# Patient Record
Sex: Female | Born: 1975 | Race: White | Hispanic: No | Marital: Married | Smoking: Never smoker
Health system: Southern US, Community
[De-identification: ages and names within clinical notes are randomized; demographics above are authoritative.]

## PROBLEM LIST (undated history)

## (undated) DIAGNOSIS — N809 Endometriosis, unspecified: Secondary | ICD-10-CM

## (undated) DIAGNOSIS — G40909 Epilepsy, unspecified, not intractable, without status epilepticus: Secondary | ICD-10-CM

---

## 2017-05-26 ENCOUNTER — Encounter: Payer: Self-pay | Admitting: Obstetrics & Gynecology

## 2019-01-19 ENCOUNTER — Other Ambulatory Visit (HOSPITAL_BASED_OUTPATIENT_CLINIC_OR_DEPARTMENT_OTHER): Payer: BC Managed Care – PPO

## 2019-01-19 ENCOUNTER — Other Ambulatory Visit: Payer: Self-pay

## 2019-01-19 ENCOUNTER — Emergency Department
Admission: EM | Admit: 2019-01-19 | Discharge: 2019-01-19 | Disposition: A | Discharge status: Home / Self Care | Payer: BC Managed Care – PPO | Source: Home / Self Care

## 2019-01-19 ENCOUNTER — Emergency Department (INDEPENDENT_AMBULATORY_CARE_PROVIDER_SITE_OTHER): Payer: BC Managed Care – PPO

## 2019-01-19 DIAGNOSIS — N200 Calculus of kidney: Secondary | ICD-10-CM

## 2019-01-19 DIAGNOSIS — R109 Unspecified abdominal pain: Secondary | ICD-10-CM

## 2019-01-19 DIAGNOSIS — N83292 Other ovarian cyst, left side: Secondary | ICD-10-CM

## 2019-01-19 DIAGNOSIS — N83202 Unspecified ovarian cyst, left side: Secondary | ICD-10-CM

## 2019-01-19 DIAGNOSIS — R1032 Left lower quadrant pain: Secondary | ICD-10-CM

## 2019-01-19 DIAGNOSIS — R102 Pelvic and perineal pain: Secondary | ICD-10-CM

## 2019-01-19 HISTORY — DX: Endometriosis, unspecified: N80.9

## 2019-01-19 HISTORY — DX: Epilepsy, unspecified, not intractable, without status epilepticus: G40.909

## 2019-01-19 LAB — COMPLETE METABOLIC PANEL WITH GFR
AG Ratio: 1.7 (calc) (ref 1.0–2.5)
ALT: 13 U/L (ref 6–29)
AST: 15 U/L (ref 10–30)
Albumin: 4.7 g/dL (ref 3.6–5.1)
Alkaline phosphatase (APISO): 88 U/L (ref 31–125)
BUN: 14 mg/dL (ref 7–25)
CO2: 26 mmol/L (ref 20–32)
Calcium: 9.7 mg/dL (ref 8.6–10.2)
Chloride: 106 mmol/L (ref 98–110)
Creat: 0.67 mg/dL (ref 0.50–1.10)
GFR, Est African American: 127 mL/min/{1.73_m2} (ref >=60)
GFR, Est Non African American: 109 mL/min/{1.73_m2} (ref >=60)
Globulin: 2.7 g/dL (calc) (ref 1.9–3.7)
Glucose, Bld: 97 mg/dL (ref 65–99)
Potassium: 3.9 mmol/L (ref 3.5–5.3)
Sodium: 139 mmol/L (ref 135–146)
Total Bilirubin: 0.2 mg/dL (ref 0.2–1.2)
Total Protein: 7.4 g/dL (ref 6.1–8.1)

## 2019-01-19 LAB — POCT URINALYSIS DIP (MANUAL ENTRY)
Bilirubin, UA: NEGATIVE
Blood, UA: NEGATIVE
Glucose, UA: NEGATIVE mg/dL
Ketones, POC UA: NEGATIVE mg/dL
Nitrite, UA: NEGATIVE
Protein Ur, POC: NEGATIVE mg/dL
Spec Grav, UA: 1.02 (ref 1.010–1.025)
Urobilinogen, UA: 0.2 E.U./dL
pH, UA: 7 (ref 5.0–8.0)

## 2019-01-19 LAB — POCT CBC W AUTO DIFF (K'VILLE URGENT CARE)

## 2019-01-19 LAB — POCT URINE PREGNANCY: Preg Test, Ur: NEGATIVE

## 2019-01-19 NOTE — ED Provider Notes (Signed)
[name]    CSN: [id number] Arrival date & time: 01/19/19  1127     History   Chief Complaint Chief Complaint  Patient presents with   Abdominal Pain    HPI Jill SkeensKimberly Guerra is a 43 y.o. female.   HPI Jill SkeensKimberly Guerra is a 43 y.o. female presenting to UC with c/o intermittent Left flank pain that radiates into left lower abdomen for the last few days.  Pain worsens after urinating.  Around 3AM this morning, pt was awakened by severe sharp pain, then had an episode of urinary incontinence, which has never happened to pt before. Pt reports hx of 5 c-sections as well as hx of severe endometriosis, ovarian cysts and removal of her Left fallopian tube after an ectopic pregnancy a few years ago.  She still has her Left ovary.  Denies pain at this time. Denies fever, chills, n/v/d.    Past Medical History:  Diagnosis Date   Endometriosis    Epilepsy (HCC)     There are no active problems to display for this patient.   Past Surgical History:  Procedure Laterality Date   CESAREAN SECTION      OB History   No obstetric history on file.      Home Medications    Prior to Admission medications   Medication Sig Start Date End Date Taking? Authorizing Provider  levETIRAcetam (KEPPRA) 750 MG tablet Take 1,500 mg by mouth. Takes 1500mg  in an and 2250 in pm   Yes [provider]    Family History History reviewed. No pertinent family history.  Social History Social History   Tobacco Use   Smoking status: Never Smoker   Smokeless tobacco: Never Used  Substance Use Topics   Alcohol use: Yes    Comment: occasionally   Drug use: Never     Allergies   Tegretol [carbamazepine]   Review of Systems Review of Systems  Constitutional: Negative for chills and fever.  Gastrointestinal: Positive for abdominal pain. Negative for diarrhea, nausea and vomiting.  Genitourinary: Positive for dysuria, flank pain and pelvic pain. Negative for  frequency, hematuria and urgency.  Musculoskeletal: Negative for back pain.  Neurological: Negative for dizziness, light-headedness and headaches.     Physical Exam Triage Vital Signs ED Triage Vitals  Enc Vitals Group     BP 01/19/19 1200 119/72     Pulse Rate 01/19/19 1151 87     Resp 01/19/19 1151 20     Temp 01/19/19 1151 98.7 F (37.1 C)     Temp Source 01/19/19 1151 Oral     SpO2 01/19/19 1151 100 %     Weight 01/19/19 1152 153 lb (69.4 kg)     Height 01/19/19 1152 5\' 4"  (1.626 m)     Head Circumference --      Peak Flow --      Pain Score 01/19/19 1152 2     Pain Loc --      Pain Edu? --      Excl. in GC? --    No data found.  Updated Vital Signs BP 119/72    Pulse 87    Temp 98.7 F (37.1 C) (Oral)    Resp 20    Ht 5\' 4"  (1.626 m)    Wt 153 lb (69.4 kg)    LMP 01/15/2019    SpO2 100%    BMI 26.26 kg/m   Visual Acuity Right Eye Distance:   Left Eye Distance:   Bilateral Distance:  Right Eye Near:   Left Eye Near:    Bilateral Near:     Physical Exam Vitals signs and nursing note reviewed.  Constitutional:      Appearance: She is well-developed.     Comments: Pt sitting on exam table, NAD.   HENT:     Head: Normocephalic and atraumatic.  Neck:     Musculoskeletal: Normal range of motion.  Cardiovascular:     Rate and Rhythm: Normal rate and regular rhythm.  Pulmonary:     Effort: Pulmonary effort is normal.     Breath sounds: Normal breath sounds.  Abdominal:     General: There is no distension.     Palpations: Abdomen is soft.     Tenderness: There is abdominal tenderness in the suprapubic area and left lower quadrant. There is left CVA tenderness. There is no right CVA tenderness.  Musculoskeletal: Normal range of motion.  Skin:    General: Skin is warm and dry.  Neurological:     Mental Status: She is alert and oriented to person, place, and time.  Psychiatric:        Behavior: Behavior normal.      UC Treatments / Results  Labs (all  labs ordered are listed, but only abnormal results are displayed) Labs Reviewed  POCT URINALYSIS DIP (MANUAL ENTRY) - Abnormal; Notable for the following components:      Result Value   Leukocytes, UA Small (1+) (*)    All other components within normal limits  URINE CULTURE  COMPLETE METABOLIC PANEL WITH GFR  CBC WITH DIFFERENTIAL/PLATELET  POCT URINE PREGNANCY  POCT CBC W AUTO DIFF ([facility])    EKG   Radiology Ct Abdomen Pelvis Wo Contrast  Result Date: 01/19/2019 CLINICAL DATA:  Severe left-sided flank pain. EXAM: CT ABDOMEN AND PELVIS WITHOUT CONTRAST TECHNIQUE: Multidetector CT imaging of the abdomen and pelvis was performed following the standard protocol without IV contrast. COMPARISON:  None. FINDINGS: Lower chest: No acute abnormality. Hepatobiliary: Scattered small simple cysts in the liver. Other subcentimeter low-density lesions are too small to characterize, but likely represent cysts as well. The gallbladder is unremarkable. No biliary dilatation. Pancreas: Unremarkable. No pancreatic ductal dilatation or surrounding inflammatory changes. Spleen: Normal in size without focal abnormality. Adrenals/Urinary Tract: The adrenal glands are unremarkable. Small bilateral renal calculi. No ureteral calculi or hydronephrosis. 1.8 cm cystic lesion in the lower pole of the left kidney with small thin peripheral calcification posteriorly. The bladder is unremarkable for the degree of distention. Stomach/Bowel: Stomach is within normal limits. Appendix appears normal. No evidence of bowel wall thickening, distention, or inflammatory changes. Vascular/Lymphatic: No significant vascular findings are present. No enlarged abdominal or pelvic lymph nodes. Reproductive: The uterus and right adnexa are unremarkable. There is a 6.6 cm complex cyst in the left adnexa with a thin internal septation and possible small fluid fluid level. Other: Trace free fluid in the pelvis is likely physiologic.  No pneumoperitoneum. Musculoskeletal: No acute or significant osseous findings. IMPRESSION: 1. 6.6 cm complex left ovarian cyst with thin internal septation and possible small fluid fluid level, potentially a hemorrhagic cyst. Pelvic ultrasound is recommended for further evaluation. 2. Small bilateral nonobstructive nephrolithiasis. 3. 1.8 cm cystic lesion in the left kidney with thin peripheral calcification, incompletely characterized. Outpatient dedicated renal protocol CT or MRI is recommended for further evaluation. This recommendation follows ACR consensus guidelines: Management of the Incidental Renal Mass on CT: A White Paper of the ACR Incidental Findings Committee. J Am  Coll Radiol 2018;15:264-273. Electronically Signed   By: Obie DredgeWilliam T Derry M.D.   On: 01/19/2019 13:48    Procedures Procedures (including critical care time)  Medications Ordered in UC Medications - No data to display  Initial Impression / Assessment and Plan / UC Course  I have reviewed the triage vital signs and the nursing notes.  Pertinent labs & imaging results that were available during my care of the patient were reviewed by me and considered in my medical decision making (see chart for details).     DDx: renal stone, ovarian cyst, less likely diverticulitis,pyelonephritis.  UA: unremarkable, no definite UTI, Culture sent  Reviewed imaging with pt. Of[facility]. Pt declined after discharge paperwork printed. Encouraged pt to f/u with PCP or GYN next week if not improving. Encouraged to go to emergency department this evening or this weekend if symptoms return/pain worsens.  Final Clinical Impressions(s) / UC Diagnoses   Final diagnoses:  Left lower quadrant abdominal pain  Left flank pain  Acute pelvic pain, female  Left ovarian cyst     Discharge Instructions      You may take 500mg  acetaminophen every 4-6 hours or in combination  with ibuprofen 400-600mg  every 6-8 hours as needed for pain and inflammation.   Please drink at least 32oz of water 1 hour before your appointment and hold your urine.   [facility].   Call 911 or go to the emergency department if symptoms worsening this evening or this weekend.       ED Prescriptions    None     Controlled Substance Prescriptions [place] Controlled Substance Registry consulted? Not Applicable   Rolla Platehelps, Ander Wamser O, PA-C 01/20/19 1004

## 2019-01-19 NOTE — Discharge Instructions (Signed)
°  You may take 500mg  acetaminophen every 4-6 hours or in combination with ibuprofen 400-600mg  every 6-8 hours as needed for pain and inflammation.   Please drink at least 32oz of water 1 hour before your appointment and hold your urine.   Your appointment is at 5:30PM at [facility] outpatient imaging.   Call 911 or go to the emergency department if symptoms worsening this evening or this weekend.

## 2019-01-19 NOTE — ED Notes (Signed)
Attempted to draw blood x 2 1- left ac, 2nd-

## 2019-01-19 NOTE — ED Notes (Signed)
Attempted to draw blood 1st in left AC, 2nd in right hand.  Unsuccessful, sent to lab for CBC and CMP.  Lab gave blood for CBC for [place] to run.

## 2019-01-19 NOTE — ED Triage Notes (Signed)
Left side pain intensifies after urinating.  This am around 3 had severe pain in left side, then had urinary incontinent.  Marland Kitchen

## 2019-01-20 ENCOUNTER — Telehealth: Payer: Self-pay

## 2019-01-20 LAB — URINE CULTURE
MICRO NUMBER:: 630562
Result:: NO GROWTH
SPECIMEN QUALITY:: ADEQUATE

## 2019-01-20 NOTE — Telephone Encounter (Signed)
Notified patient of lab results, says that she is feeling better.  Will follow up as needed.

## 2020-02-23 IMAGING — CT CT ABDOMEN AND PELVIS WITHOUT CONTRAST
2 of 8 series · 11 of 46 positions shown, 12 images · non-contrast
Comparison: None.

CLINICAL DATA: Severe left-sided flank pain.

EXAM:
CT ABDOMEN AND PELVIS WITHOUT CONTRAST
TECHNIQUE: Multidetector CT imaging of the abdomen and pelvis was performed
following the standard protocol without IV contrast.

[Series 4: coronal st · coronal · 0.18mm/px · 3 of 84 slices shown]
[im 21/84  soft-tissue]
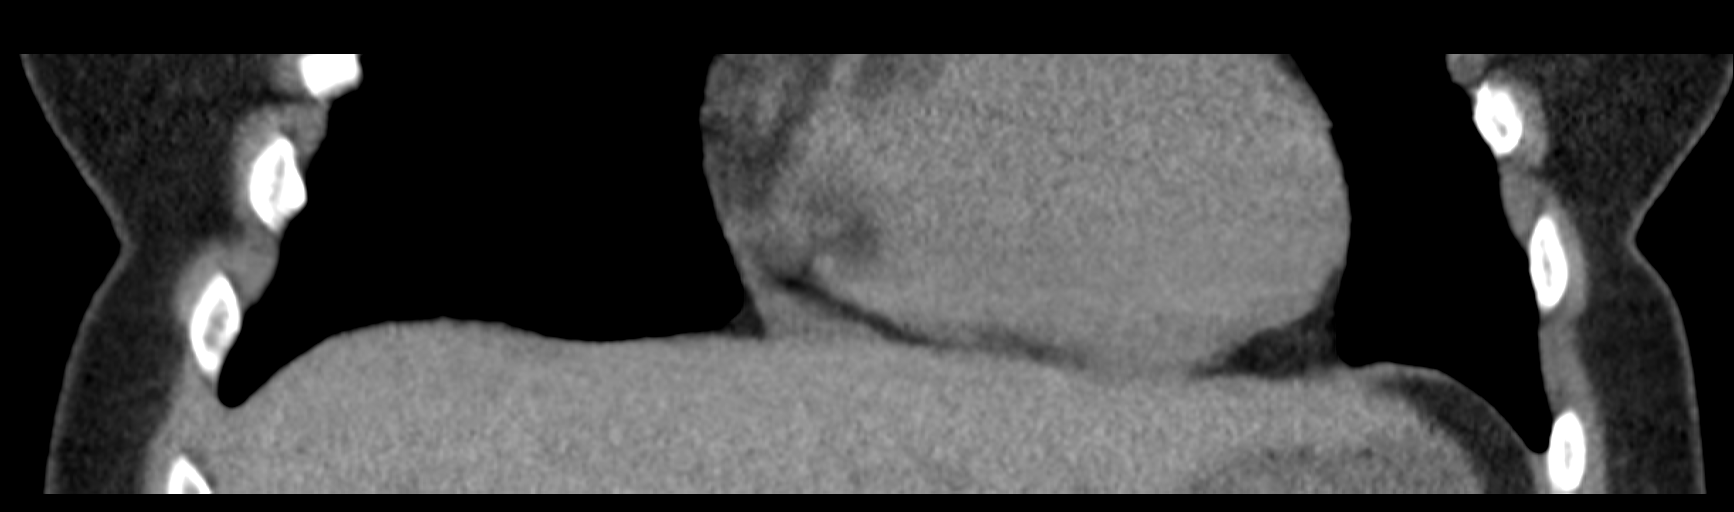
[im 42/84  soft-tissue]
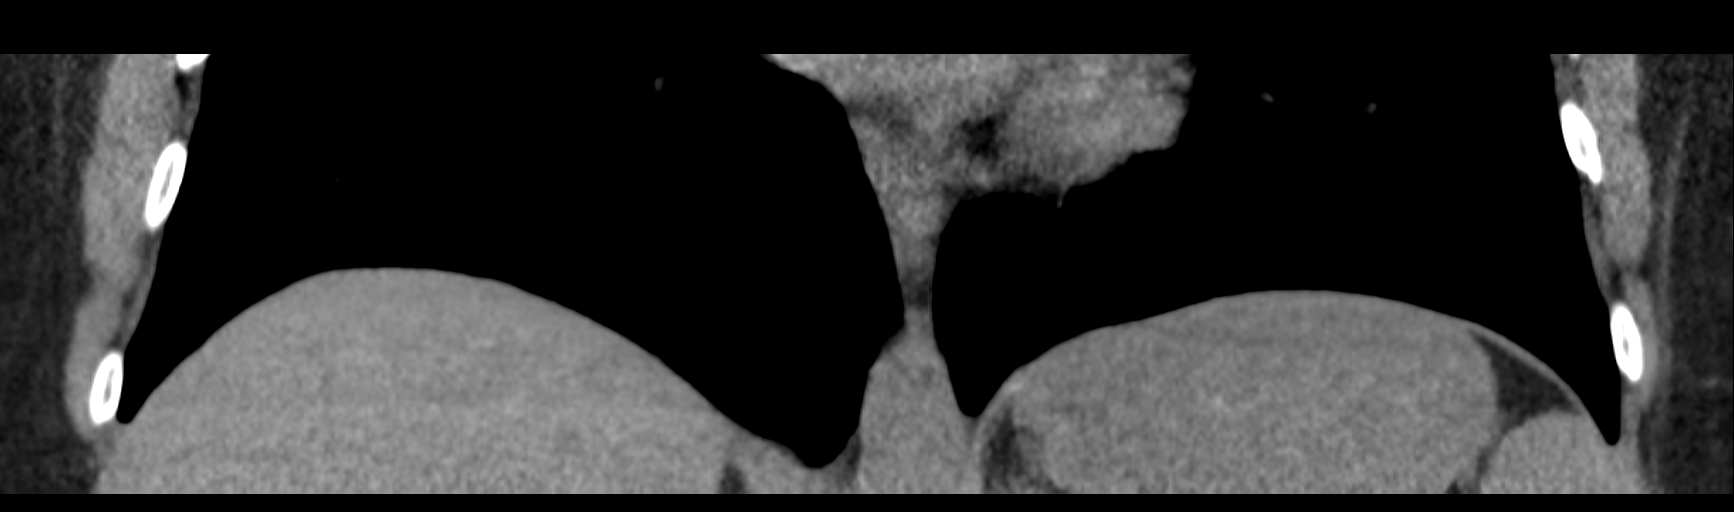
[im 63/84  soft-tissue]
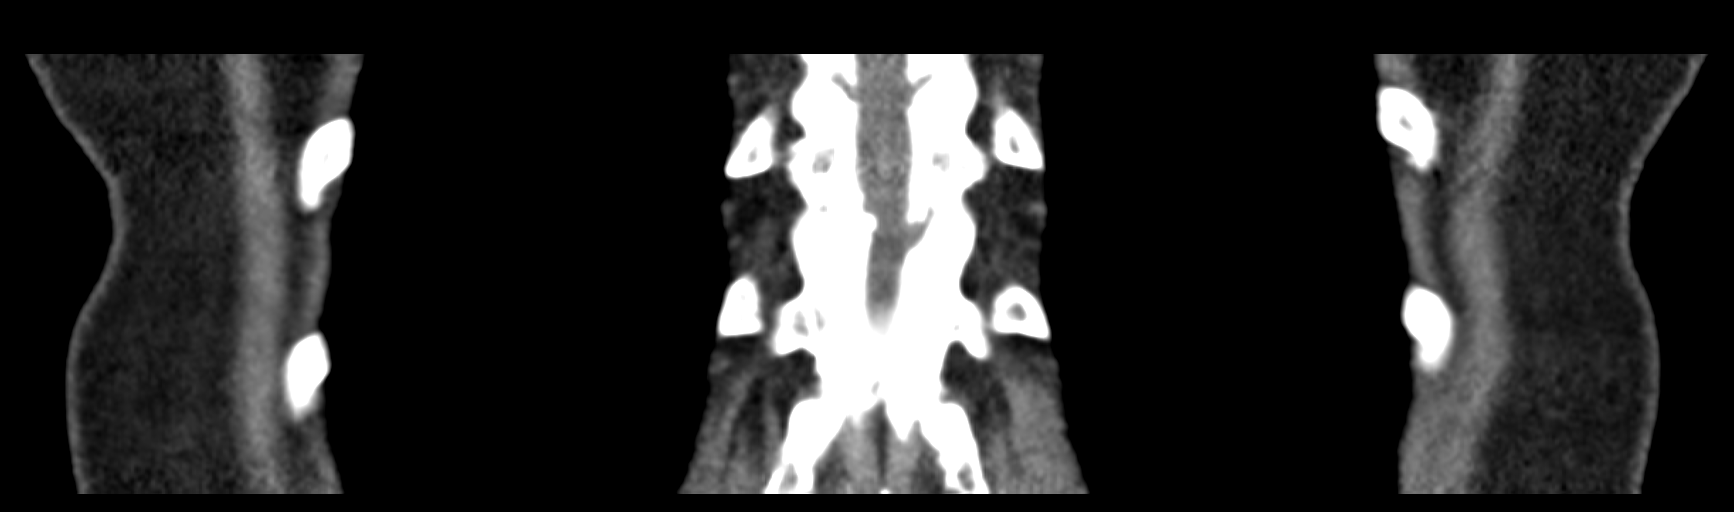

[Series 7: axial st · axial · 0.72mm/px · z∈[-522,-187]mm · 8 of 85 slices shown, 9 images]
[im 9/85  soft-tissue]
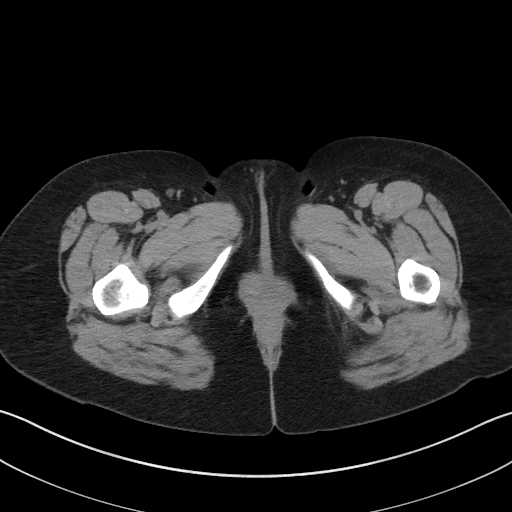
[im 9/85  bone]
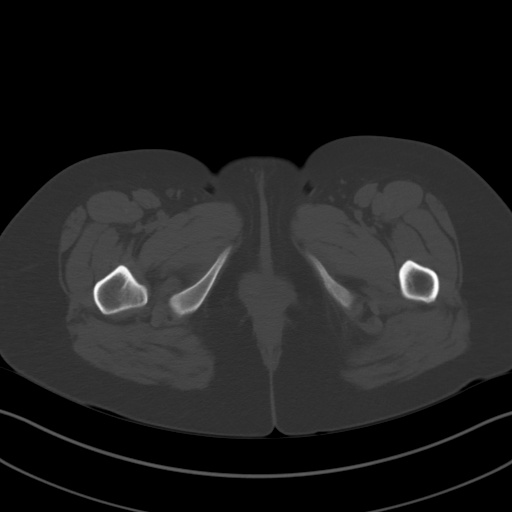
[im 18/85  soft-tissue]
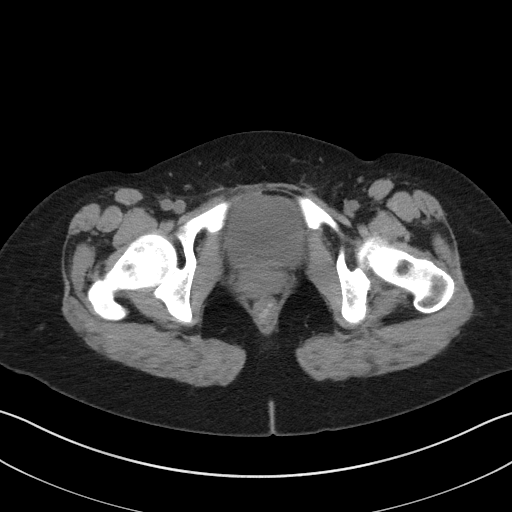
[im 27/85  soft-tissue]
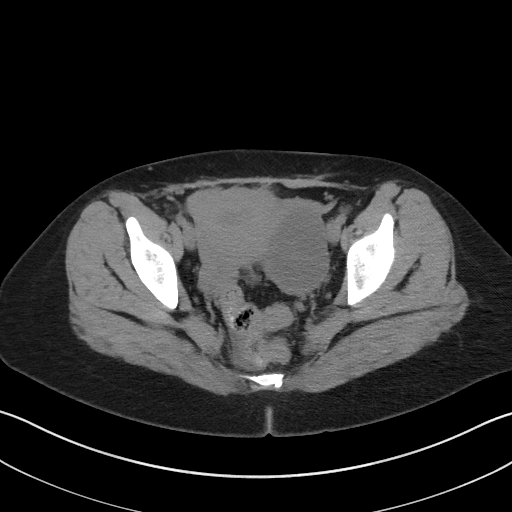
[im 36/85  soft-tissue]
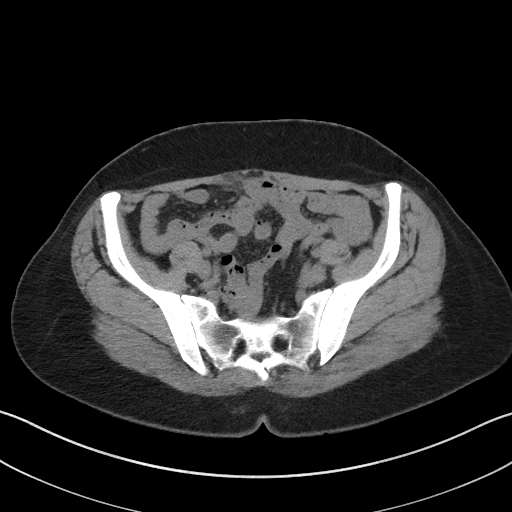
[im 49/85  soft-tissue]
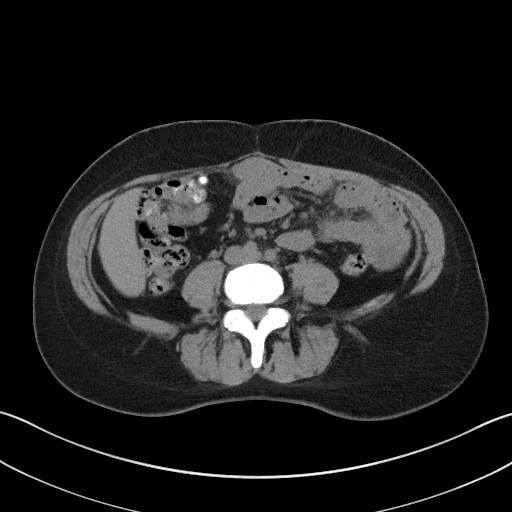
[im 58/85  soft-tissue]
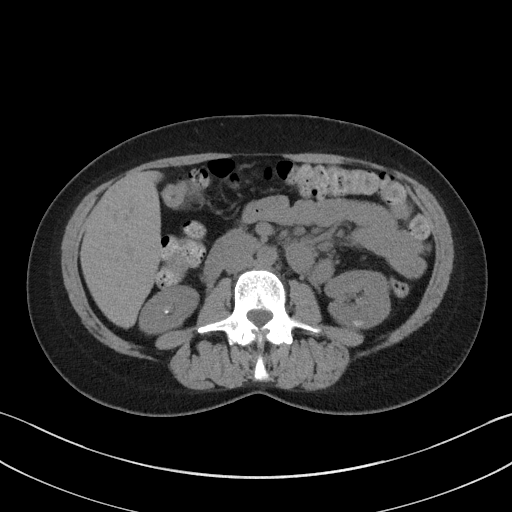
[im 67/85  soft-tissue]
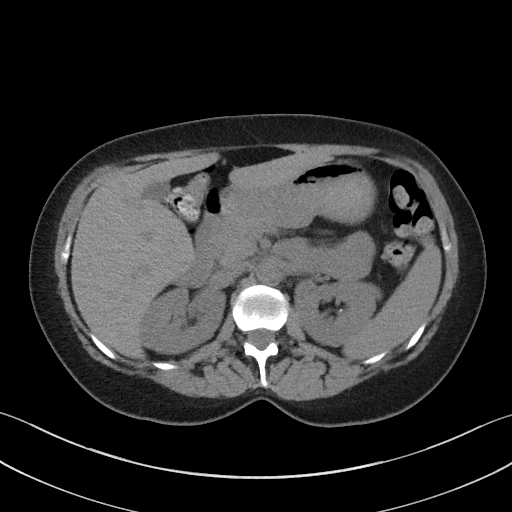
[im 76/85  soft-tissue]
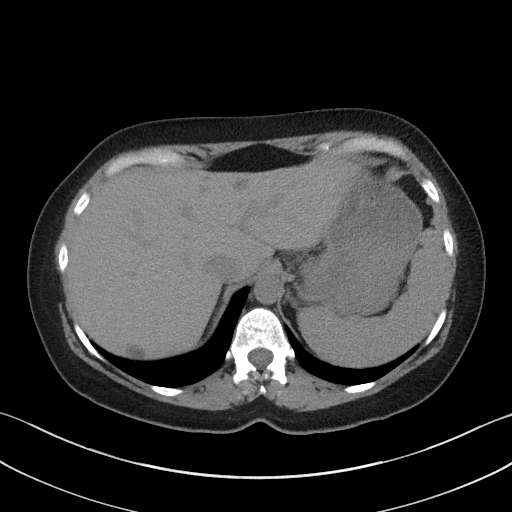

[11 of 46 positions shown; findings below may reference images not displayed]

FINDINGS: Lower chest: No acute abnormality.

Hepatobiliary: Scattered small simple cysts in the liver. Other
subcentimeter low-density lesions are too small to characterize, but
likely represent cysts as well. The gallbladder is unremarkable. No
biliary dilatation.

Pancreas: Unremarkable. No pancreatic ductal dilatation or
surrounding inflammatory changes.

Spleen: Normal in size without focal abnormality.

Adrenals/Urinary Tract: The adrenal glands are unremarkable. Small
bilateral renal calculi. No ureteral calculi or hydronephrosis.
cm cystic lesion in the lower pole of the left kidney with small
thin peripheral calcification posteriorly. The bladder is
unremarkable for the degree of distention.

Stomach/Bowel: Stomach is within normal limits. Appendix appears
normal. No evidence of bowel wall thickening, distention, or
inflammatory changes.

Vascular/Lymphatic: No significant vascular findings are present. No
enlarged abdominal or pelvic lymph nodes.

Reproductive: The uterus and right adnexa are unremarkable. There is
a 6.6 cm complex cyst in the left adnexa with a thin internal
septation and possible small fluid fluid level.

Other: Trace free fluid in the pelvis is likely physiologic. No
pneumoperitoneum.

Musculoskeletal: No acute or significant osseous findings.
IMPRESSION: 1. 6.6 cm complex left ovarian cyst with thin internal septation and
possible small fluid fluid level, potentially a hemorrhagic cyst.
Pelvic ultrasound is recommended for further evaluation.
2. Small bilateral nonobstructive nephrolithiasis.
3. 1.8 cm cystic lesion in the left kidney with thin peripheral
calcification, incompletely characterized. Outpatient dedicated
renal protocol CT or MRI is recommended for further evaluation. This
recommendation follows ACR consensus guidelines: Management of the
Incidental Renal Mass on CT: A White Paper of the ACR Incidental
Findings Committee. [HOSPITAL] 5358;[DATE].
